# Patient Record
Sex: Female | Born: 1975 | Race: Black or African American | Hispanic: No | Marital: Single | Smoking: Never smoker
Health system: Southern US, Community
[De-identification: ages and names within clinical notes are randomized; demographics above are authoritative.]

## PROBLEM LIST (undated history)

## (undated) DIAGNOSIS — Z803 Family history of malignant neoplasm of breast: Secondary | ICD-10-CM

## (undated) DIAGNOSIS — Z800 Family history of malignant neoplasm of digestive organs: Secondary | ICD-10-CM

## (undated) HISTORY — PX: MOUTH SURGERY: SHX715

## (undated) HISTORY — DX: Family history of malignant neoplasm of digestive organs: Z80.0

## (undated) HISTORY — DX: Family history of malignant neoplasm of breast: Z80.3

---

## 2016-07-10 ENCOUNTER — Ambulatory Visit: Payer: Self-pay | Admitting: Physician Assistant

## 2016-07-10 VITALS — BP 110/70 | HR 85 | Temp 98.5°F

## 2016-07-10 DIAGNOSIS — H6121 Impacted cerumen, right ear: Secondary | ICD-10-CM

## 2016-07-10 NOTE — Progress Notes (Signed)
S: c/o r ear feeling full, used otc ear drops and sweet oil without relief, no fever/chills or head congestion  O: vitals wnl, nad, r ear with cerumen impaction, left ear wnl, neck supple no lymph, lungs c t a, cv rrr, irrigated r ear with warm water, wax removed  A: cerumen impaction  P: f/u prn

## 2016-12-07 ENCOUNTER — Ambulatory Visit: Payer: Self-pay | Admitting: Family

## 2016-12-07 VITALS — BP 119/70 | HR 79 | Temp 98.4°F

## 2016-12-07 DIAGNOSIS — H6123 Impacted cerumen, bilateral: Secondary | ICD-10-CM

## 2016-12-07 NOTE — Progress Notes (Signed)
S / can not hear on R , no ENT , resp acute sxs O VSS R EAC occluded with soft cerumen , L partially occluded with cerumen.  A/ Impacted cerumen bilat P Irrigation successful ,tolerated well. Preventive measures discussed .

## 2017-03-07 ENCOUNTER — Ambulatory Visit: Payer: Self-pay | Admitting: Physician Assistant

## 2017-03-07 ENCOUNTER — Encounter: Payer: Self-pay | Admitting: Physician Assistant

## 2017-03-07 VITALS — BP 110/70 | HR 97 | Temp 98.3°F | Ht 65.0 in | Wt 193.0 lb

## 2017-03-07 DIAGNOSIS — Z0000 Encounter for general adult medical examination without abnormal findings: Secondary | ICD-10-CM

## 2017-03-07 NOTE — Progress Notes (Signed)
S: pt here for wellness physical had biometrics for insurance purposes done at work, hasn't seen her gyn in about 2 years, no complaints ros neg. PMH:  neg  Social: nonsmoker, social etoh, no drugs  Fam: mother died at 5764 of colon CA, father +htn, 1/2 sister with breast ca, dm, thyroid problems, and ibs, 1/2 sister#2 with high cholesterol, 2 brothers are healthy, 1 sister is healthy  O: vitals wnl, nad, ENT wnl, neck supple no lymph, lungs c t a, cv rrr, abd soft nontender bs normal all 4 quads  A: wellness physical  P: f/u with gyn for pelvic/pap, return if any problems

## 2017-06-05 ENCOUNTER — Ambulatory Visit: Payer: Self-pay | Admitting: Physician Assistant

## 2017-06-05 VITALS — BP 120/70 | HR 75 | Temp 98.5°F | Resp 16

## 2017-06-05 DIAGNOSIS — S46912A Strain of unspecified muscle, fascia and tendon at shoulder and upper arm level, left arm, initial encounter: Secondary | ICD-10-CM

## 2017-06-05 MED ORDER — NAPROXEN 500 MG PO TABS: 500.0000 mg | ORAL_TABLET | Freq: Two times a day (BID) | ORAL | 0 refills | Status: DC

## 2017-06-05 NOTE — Progress Notes (Signed)
S:  Here with left shoulder pain.  Lifting a lot and has been hurting about 1 week.  Took low dose and infrequent IBU without improvement.   O:  Tender trap muscle to palpation.  No limitation to ROM, no crepitus.  Good muscle strength. A:  Left shoulder strain P: Naprosyn 500mg  bid and moist heat/ ice to area

## 2018-04-11 ENCOUNTER — Ambulatory Visit: Payer: Self-pay | Admitting: Family Medicine

## 2018-04-11 VITALS — BP 134/71 | HR 76 | Resp 16 | Ht 64.0 in | Wt 188.0 lb

## 2018-04-11 DIAGNOSIS — Z0189 Encounter for other specified special examinations: Principal | ICD-10-CM

## 2018-04-11 DIAGNOSIS — Z008 Encounter for other general examination: Secondary | ICD-10-CM

## 2018-04-11 NOTE — Progress Notes (Signed)
Subjective: Annual biometrics screening  Patient presents for her annual biometric screening. Patient reports eating a well-rounded diet but that there is room for improvement in the amount of healthy food she eats.  Patient reports getting physical activity once a week but plans to increase this in the near future.  Patient does not regularly see a primary care provider. PCP: None currently. Patient works for [employer]. Patient denies any other issues or concerns.   Review of Systems Unremarkable  Objective  Physical Exam General: Awake, alert and oriented. No acute distress. Well developed, hydrated and nourished. Appears stated age.  HEENT: Supple neck without adenopathy. Sclera is non-icteric. The ear canal is clear without discharge. The tympanic membrane is normal in appearance with normal landmarks and cone of light. Nasal mucosa is pink and moist. Oral mucosa is pink and moist. The pharynx is normal in appearance without tonsillar swelling or exudates.  Skin: Skin in warm, dry and intact without rashes or lesions. Appropriate color for ethnicity. Cardiac: Heart rate and rhythm are normal. No murmurs, gallops, or rubs are auscultated.  Respiratory: The chest wall is symmetric and without deformity. No signs of respiratory distress. Lung sounds are clear in all lobes bilaterally without rales, ronchi, or wheezes.  Neurological: The patient is awake, alert and oriented to person, place, and time with normal speech.  Memory is normal and thought processes intact. No gait abnormalities are appreciated.  Psychiatric: Appropriate mood and affect.   Assessment Annual biometrics screening  Plan  Lipid panel and fasting blood sugar pending. Encouraged routine visits with primary care provider.  Provided patient with a list of local resources and encouraged her to establish care with a primary care provider within the next month. Encouraged patient to get regular exercise and eat a healthy,  well-rounded diet.

## 2018-04-12 LAB — LIPID PANEL
CHOL/HDL RATIO: 3 ratio (ref 0.0–4.4)
Cholesterol, Total: 184 mg/dL (ref 100–199)
HDL: 62 mg/dL (ref >39)
LDL Calculated: 103 mg/dL — ABNORMAL HIGH (ref 0–99)
Triglycerides: 93 mg/dL (ref 0–149)
VLDL Cholesterol Cal: 19 mg/dL (ref 5–40)

## 2018-04-12 LAB — GLUCOSE, RANDOM: GLUCOSE: 79 mg/dL (ref 65–99)

## 2018-04-14 NOTE — Progress Notes (Signed)
[name], Will you call the patient and inform them that their lipid panel and fasting blood sugar came back?  Everything is normal, with the exception of her LDL cholesterol. The LDL cholesterol ("bad cholesterol") is elevated at 103, normal values are below 99. Please advise the patient to follow-up with their primary care provider regarding these results.

## 2019-04-01 ENCOUNTER — Other Ambulatory Visit: Payer: Self-pay | Admitting: Obstetrics and Gynecology

## 2019-04-01 DIAGNOSIS — R928 Other abnormal and inconclusive findings on diagnostic imaging of breast: Secondary | ICD-10-CM

## 2019-04-17 ENCOUNTER — Ambulatory Visit
Admission: RE | Admit: 2019-04-17 | Discharge: 2019-04-17 | Disposition: A | Discharge status: Home / Self Care | Payer: Managed Care, Other (non HMO) | Source: Ambulatory Visit | Attending: Obstetrics and Gynecology | Admitting: Obstetrics and Gynecology

## 2019-04-17 ENCOUNTER — Other Ambulatory Visit: Payer: Self-pay | Admitting: Obstetrics and Gynecology

## 2019-04-17 ENCOUNTER — Other Ambulatory Visit: Payer: Self-pay

## 2019-04-17 DIAGNOSIS — R928 Other abnormal and inconclusive findings on diagnostic imaging of breast: Secondary | ICD-10-CM

## 2019-04-20 ENCOUNTER — Other Ambulatory Visit: Payer: Managed Care, Other (non HMO)

## 2019-04-27 ENCOUNTER — Ambulatory Visit
Admission: RE | Admit: 2019-04-27 | Discharge: 2019-04-27 | Disposition: A | Discharge status: Home / Self Care | Payer: Managed Care, Other (non HMO) | Source: Ambulatory Visit | Attending: Obstetrics and Gynecology | Admitting: Obstetrics and Gynecology

## 2019-04-27 ENCOUNTER — Other Ambulatory Visit: Payer: Self-pay | Admitting: Obstetrics and Gynecology

## 2019-04-27 DIAGNOSIS — N6489 Other specified disorders of breast: Secondary | ICD-10-CM

## 2019-04-27 DIAGNOSIS — R928 Other abnormal and inconclusive findings on diagnostic imaging of breast: Secondary | ICD-10-CM

## 2019-07-29 ENCOUNTER — Ambulatory Visit (INDEPENDENT_AMBULATORY_CARE_PROVIDER_SITE_OTHER): Payer: Managed Care, Other (non HMO) | Admitting: Internal Medicine

## 2019-07-29 ENCOUNTER — Encounter: Payer: Self-pay | Admitting: Internal Medicine

## 2019-07-29 VITALS — BP 124/72 | HR 72 | Temp 98.1°F | Ht 64.0 in | Wt 187.6 lb

## 2019-07-29 DIAGNOSIS — Z1211 Encounter for screening for malignant neoplasm of colon: Secondary | ICD-10-CM

## 2019-07-29 DIAGNOSIS — Z800 Family history of malignant neoplasm of digestive organs: Secondary | ICD-10-CM

## 2019-07-29 NOTE — Progress Notes (Signed)
   Denise Jones 43 y.o. February 24, 1976 [id number] Referred by: Brien Few, MD  Assessment & Plan:   Encounter Diagnoses  Name Primary?  . Colon cancer screening Yes  . Family history of colon cancer in mother - 39     I believe it is appropriate for her to have a colonoscopy.  Further plans pending that result.   The risks and benefits as well as alternatives of endoscopic procedure(s) have been discussed and reviewed. All questions answered. The patient agrees to proceed.   Patient was not charged for the visit, we only needed to schedule the colonoscopy and we often do that with a nurse visit alone.  I was happy to see her nevertheless.   I appreciate the opportunity to care for this patient. Copy to Dr. Brien Few   Subjective:   Chief Complaint: Family history of colon cancer in mother schedule colonoscopy  HPI The patient has no active GI symptoms.  She is here to discuss scheduling a screening colonoscopy because her mother was diagnosed and died from colon cancer at ages 18 and 63.  No other family members with colon cancer. No Known Allergies No outpatient medications have been marked as taking for the 07/29/19 encounter (Office Visit) with Gatha Mayer, MD.   History reviewed. No pertinent past medical history. Past Surgical History:  Procedure Laterality Date  . MOUTH SURGERY     Social History   Social History Narrative   Single, lives alone no children.  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   Prior work in Therapist, art.   Graduate of [employer] originally from [place]   No alcohol tobacco or drug use and 1 caffeinated beverage daily   family history includes Breast cancer in her cousin and maternal aunt; Breast cancer (age of onset: 49) in her sister; Colon cancer (age of onset: 68) in her mother; Diabetes in her mother and sister; Hyperlipidemia in her sister; Hypertension in her father; Irritable  bowel syndrome in her sister; Thyroid disease in her sister.   Review of Systems Negative  Objective:   Physical Exam BP 124/72   Pulse 72   Temp 98.1 F (36.7 C) (Oral)   Ht 5\' 4"  (1.626 m)   Wt 187 lb 9.6 oz (85.1 kg)   LMP 07/29/2019 (Exact Date)   BMI 32.20 kg/m  No acute distress Lungs clear Normal heart sounds

## 2019-07-29 NOTE — Patient Instructions (Signed)
You have been scheduled for a colonoscopy. Please follow written instructions given to you at your visit today.  Please pick up your prep supplies at the pharmacy within the next 1-3 days. If you use inhalers (even only as needed), please bring them with you on the day of your procedure. Your physician has requested that you go to www.startemmi.com and enter the access code given to you at your visit today. This web site gives a general overview about your procedure. However, you should still follow specific instructions given to you by our office regarding your preparation for the procedure.  If you are age 95 or older, your body mass index should be between 23-30. Your Body mass index is 32.2 kg/m. If this is out of the aforementioned range listed, please consider follow up with your Primary Care Provider.  If you are age 74 or younger, your body mass index should be between 19-25. Your Body mass index is 32.2 kg/m. If this is out of the aformentioned range listed, please consider follow up with your Primary Care Provider.

## 2019-08-20 ENCOUNTER — Encounter: Payer: Self-pay | Admitting: Internal Medicine

## 2019-08-21 ENCOUNTER — Telehealth: Payer: Self-pay | Admitting: Internal Medicine

## 2019-08-21 NOTE — Telephone Encounter (Signed)

## 2019-08-24 ENCOUNTER — Encounter: Payer: Self-pay | Admitting: Internal Medicine

## 2019-08-24 ENCOUNTER — Ambulatory Visit (AMBULATORY_SURGERY_CENTER): Payer: Managed Care, Other (non HMO) | Admitting: Internal Medicine

## 2019-08-24 ENCOUNTER — Other Ambulatory Visit: Payer: Self-pay

## 2019-08-24 VITALS — BP 101/78 | HR 75 | Temp 98.3°F | Resp 13 | Ht 64.0 in | Wt 187.0 lb

## 2019-08-24 DIAGNOSIS — Z800 Family history of malignant neoplasm of digestive organs: Secondary | ICD-10-CM | POA: Diagnosis not present

## 2019-08-24 DIAGNOSIS — Z1211 Encounter for screening for malignant neoplasm of colon: Secondary | ICD-10-CM

## 2019-08-24 MED ORDER — SODIUM CHLORIDE 0.9 % IV SOLN: 500.0000 mL | Freq: Once | INTRAVENOUS | Status: DC

## 2019-08-24 NOTE — Progress Notes (Signed)
Temp taken by LS VS taken by CW 

## 2019-08-24 NOTE — Patient Instructions (Addendum)
The colonoscopy was normal.  Your next routine colonoscopy should be in 5 years - 2025.  I appreciate the opportunity to care for you. Gatha Mayer, MD, FACG   YOU HAD AN ENDOSCOPIC PROCEDURE TODAY AT [facility]:   Refer to the procedure report that was given to you for any specific questions about what was found during the examination.  If the procedure report does not answer your questions, please call your gastroenterologist to clarify.  If you requested that your care partner not be given the details of your procedure findings, then the procedure report has been included in a sealed envelope for you to review at your convenience later.  YOU SHOULD EXPECT: Some feelings of bloating in the abdomen. Passage of more gas than usual.  Walking can help get rid of the air that was put into your GI tract during the procedure and reduce the bloating. If you had a lower endoscopy (such as a colonoscopy or flexible sigmoidoscopy) you may notice spotting of blood in your stool or on the toilet paper. If you underwent a bowel prep for your procedure, you may not have a normal bowel movement for a few days.  Please Note:  You might notice some irritation and congestion in your nose or some drainage.  This is from the oxygen used during your procedure.  There is no need for concern and it should clear up in a day or so.  SYMPTOMS TO REPORT IMMEDIATELY:   Following lower endoscopy (colonoscopy or flexible sigmoidoscopy):  Excessive amounts of blood in the stool  Significant tenderness or worsening of abdominal pains  Swelling of the abdomen that is new, acute  Fever of 100F or higher   For urgent or emergent issues, a gastroenterologist can be reached at any hour by calling [phone].   DIET:  We do recommend a small meal at first, but then you may proceed to your regular diet.  Drink plenty of fluids but you should avoid alcoholic beverages for 24 hours.  ACTIVITY:  You  should plan to take it easy for the rest of today and you should NOT DRIVE or use heavy machinery until tomorrow (because of the sedation medicines used during the test).    FOLLOW UP: Our staff will call the number listed on your records 48-72 hours following your procedure to check on you and address any questions or concerns that you may have regarding the information given to you following your procedure. If we do not reach you, we will leave a message.  We will attempt to reach you two times.  During this call, we will ask if you have developed any symptoms of COVID 19. If you develop any symptoms (ie: fever, flu-like symptoms, shortness of breath, cough etc.) before then, please call [phone].  If you test positive for Covid 19 in the 2 weeks post procedure, please call and report this information to [place].    If any biopsies were taken you will be contacted by phone or by letter within the next 1-3 weeks.  Please call us at [phone] if you have not heard about the biopsies in 3 weeks.    SIGNATURES/CONFIDENTIALITY: You and/or your care partner have signed paperwork which will be entered into your electronic medical record.  These signatures attest to the fact that that the information above on your After Visit Summary has been reviewed and is understood.  Full responsibility of the confidentiality of this discharge information lies  with you and/or your care-partner.

## 2019-08-24 NOTE — Op Note (Signed)
[facility] Patient Name: Denise Jones Procedure Date: 08/24/2019 10:15 AM MRN: [id number] Endoscopist: Gatha Mayer , MD Age: 43 Referring MD:  Date of Birth: [birth year] Gender: Female Account #: [id number] Procedure:                Colonoscopy Indications:              Screening in patient at increased risk: Colorectal                            cancer in mother before age 55 Medicines:                Propofol per Anesthesia, Monitored Anesthesia Care Procedure:                Pre-Anesthesia Assessment:                           - Prior to the procedure, a History and Physical                            was performed, and patient medications and                            allergies were reviewed. The patient's tolerance of                            previous anesthesia was also reviewed. The risks                            and benefits of the procedure and the sedation                            options and risks were discussed with the patient.                            All questions were answered, and informed consent                            was obtained. Prior Anticoagulants: The patient has                            taken no previous anticoagulant or antiplatelet                            agents. ASA Grade Assessment: II - A patient with                            mild systemic disease. After reviewing the risks                            and benefits, the patient was deemed in                            satisfactory condition to undergo the procedure.  After obtaining informed consent, the colonoscope                            was passed under direct vision. Throughout the                            procedure, the patient's blood pressure, pulse, and                            oxygen saturations were monitored continuously. The                            Colonoscope was introduced through the anus and   advanced to the the cecum, identified by                            appendiceal orifice and ileocecal valve. The                            quality of the bowel preparation was excellent. The                            colonoscopy was performed without difficulty. The                            patient tolerated the procedure well. The bowel                            preparation used was Miralax via split dose                            instruction. Scope In: 10:24:29 AM Scope Out: 10:36:15 AM Scope Withdrawal Time: 0 hours 8 minutes 58 seconds  Total Procedure Duration: 0 hours 11 minutes 46 seconds  Findings:                 The perianal and digital rectal examinations were                            normal.                           The colon (entire examined portion) appeared normal.                           No additional abnormalities were found on                            retroflexion. Complications:            No immediate complications. Estimated blood loss:                            None. Estimated Blood Loss:     Estimated blood loss: none. Impression:               - The entire examined colon is normal.                           -  No specimens collected.                           Family hx colon cancer - mom dxed pre 3960 and died                            at 3362 Recommendation:           - Repeat colonoscopy in 5 years for screening                            purposes.                           - Patient has a contact number available for                            emergencies. The signs and symptoms of potential                            delayed complications were discussed with the                            patient. Return to normal activities tomorrow.                            Written discharge instructions were provided to the                            patient.                           - Resume previous diet.                           - Continue present  medications. Iva Booparl E Gessner, MD 08/24/2019 10:41:06 AM This report has been signed electronically.

## 2019-08-24 NOTE — Progress Notes (Signed)
To PACU, VSS. Report to [email or web address] 

## 2019-08-26 ENCOUNTER — Telehealth: Payer: Self-pay

## 2019-08-26 NOTE — Telephone Encounter (Signed)
  Follow up Call-  Call back number 08/24/2019  Post procedure Call Back phone  # [phone]  Permission to leave phone message Yes  Some recent data might be hidden     Patient questions:  Do you have a fever, pain , or abdominal swelling? No. Pain Score  0 *  Have you tolerated food without any problems? Yes.    Have you been able to return to your normal activities? Yes.    Do you have any questions about your discharge instructions: Diet   No. Medications  No. Follow up visit  No.  Do you have questions or concerns about your Care? No.  Actions: * If pain score is 4 or above: No action needed, pain <4.  1. Have you developed a fever since your procedure? no  2.   Have you had an respiratory symptoms (SOB or cough) since your procedure? no  3.   Have you tested positive for COVID 19 since your procedure no  4.   Have you had any family members/close contacts diagnosed with the COVID 19 since your procedure?  no   If yes to any of these questions please route to Joylene John, RN and Alphonsa Gin, Therapist, sports.

## 2019-11-03 ENCOUNTER — Other Ambulatory Visit: Payer: Managed Care, Other (non HMO)

## 2020-03-17 ENCOUNTER — Ambulatory Visit: Payer: Managed Care, Other (non HMO) | Admitting: Nurse Practitioner

## 2020-03-17 ENCOUNTER — Other Ambulatory Visit: Payer: Self-pay

## 2020-03-17 ENCOUNTER — Encounter: Payer: Self-pay | Admitting: Nurse Practitioner

## 2020-03-17 VITALS — BP 124/80 | HR 72 | Temp 98.0°F | Resp 18 | Ht 63.0 in | Wt 192.0 lb

## 2020-03-17 DIAGNOSIS — Z008 Encounter for other general examination: Secondary | ICD-10-CM

## 2020-03-17 NOTE — Progress Notes (Signed)
Subjective:     Patient ID: Denise Jones, female   DOB: Aug 25, 1976, 44 y.o.   MRN: [id number]  HPI Denise Jones is a 44 y.o. female who presents to the [facility] for her biomedical screening. 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 She reports that she enjoys her job but it can be stressful. She denies any health problems. Employee reports that due to Covid she has not had a regular exercise routine the past year but does try and eat a healthy diet.   Review of Systems  Psychiatric/Behavioral:       Occasional stress with the job.   All other systems reviewed and are negative.     PMH: No health problems Meds: None Allergies: NKDA Immunizations: Has had both does of Covid Vaccine  Objective: BP 124/80 (BP Location: Right Arm, Patient Position: Sitting, Cuff Size: Normal)   Pulse 72   Temp 98 F (36.7 C) (Temporal)   Resp 18   Ht 5\' 3"  (1.6 m)   Wt 192 lb (87.1 kg)   SpO2 98%   BMI 34.01 kg/m     Physical Exam Vitals and nursing note reviewed.  Constitutional:      General: She is not in acute distress.    Appearance: Normal appearance.  HENT:     Head: Normocephalic and atraumatic.     Right Ear: Tympanic membrane, ear canal and external ear normal.     Left Ear: Tympanic membrane, ear canal and external ear normal.     Nose: Nose normal.     Mouth/Throat:     Mouth: Mucous membranes are moist.     Pharynx: Oropharynx is clear.  Eyes:     Conjunctiva/sclera: Conjunctivae normal.  Neck:     Vascular: No carotid bruit.     Trachea: Trachea normal.  Cardiovascular:     Rate and Rhythm: Normal rate and regular rhythm.  Pulmonary:     Effort: Pulmonary effort is normal.     Breath sounds: Normal breath sounds.  Abdominal:     Tenderness: There is no abdominal tenderness. There is no right CVA tenderness or left CVA tenderness.  Musculoskeletal:        General: Normal range of motion.     Cervical back: Normal range of  motion.  Lymphadenopathy:     Cervical: No cervical adenopathy.  Skin:    General: Skin is warm and dry.  Neurological:     General: No focal deficit present.     Mental Status: She is alert.     Motor: No weakness or pronator drift.     Coordination: Romberg sign negative.     Gait: Gait normal.     Deep Tendon Reflexes:     Reflex Scores:      Bicep reflexes are 2+ on the right side and 2+ on the left side.      Brachioradialis reflexes are 2+ on the right side and 2+ on the left side.      Patellar reflexes are 2+ on the right side and 2+ on the left side.    Comments: Ambulatory with steady gait, stands on one foot without difficulty, grips are equal.   Psychiatric:        Mood and Affect: Mood normal.        Behavior: Behavior normal.        Assessment:    1. Encounter for other general examination  2. Encounter for biometric screening - Lipid panel pending - Glucose, random pending Limited physical exam normal    Plan:    Discussed with the employee importance of healthy diet and exercise. Encouraged employee to establish care with a PCP. Instructions given verbally and in writing. Employee given opportunity to ask questions. All questions answered and employee voices understanding. She will RTC as needed.

## 2020-03-17 NOTE — Patient Instructions (Addendum)
Health Maintenance, Female Adopting a healthy lifestyle and getting preventive care are important in promoting health and wellness. Ask your health care provider about:  The right schedule for you to have regular tests and exams.  Things you can do on your own to prevent diseases and keep yourself healthy. What should I know about diet, weight, and exercise? Eat a healthy diet   Eat a diet that includes plenty of vegetables, fruits, low-fat dairy products, and lean protein.  Do not eat a lot of foods that are high in solid fats, added sugars, or sodium. Maintain a healthy weight Body mass index (BMI) is used to identify weight problems. It estimates body fat based on height and weight. Your health care provider can help determine your BMI and help you achieve or maintain a healthy weight. Get regular exercise Get regular exercise. This is one of the most important things you can do for your health. Most adults should:  Exercise for at least 150 minutes each week. The exercise should increase your heart rate and make you sweat (moderate-intensity exercise).  Do strengthening exercises at least twice a week. This is in addition to the moderate-intensity exercise.  Spend less time sitting. Even light physical activity can be beneficial. Watch cholesterol and blood lipids Have your blood tested for lipids and cholesterol at 42 years of age, then have this test every 5 years. Have your cholesterol levels checked more often if:  Your lipid or cholesterol levels are high.  You are older than 42 years of age.  You are at high risk for heart disease. What should I know about cancer screening? Depending on your health history and family history, you may need to have cancer screening at various ages. This may include screening for:  Breast cancer.  Cervical cancer.  Colorectal cancer.  Skin cancer.  Lung cancer. What should I know about heart disease, diabetes, and high blood  pressure? Blood pressure and heart disease  High blood pressure causes heart disease and increases the risk of stroke. This is more likely to develop in people who have high blood pressure readings, are of African descent, or are overweight.  Have your blood pressure checked: ? Every 3-5 years if you are 18-39 years of age. ? Every year if you are 40 years old or older. Diabetes Have regular diabetes screenings. This checks your fasting blood sugar level. Have the screening done:  Once every three years after age 40 if you are at a normal weight and have a low risk for diabetes.  More often and at a younger age if you are overweight or have a high risk for diabetes. What should I know about preventing infection? Hepatitis B If you have a higher risk for hepatitis B, you should be screened for this virus. Talk with your health care provider to find out if you are at risk for hepatitis B infection. Hepatitis C Testing is recommended for:  Everyone born from 1945 through 1965.  Anyone with known risk factors for hepatitis C. Sexually transmitted infections (STIs)  Get screened for STIs, including gonorrhea and chlamydia, if: ? You are sexually active and are younger than 42 years of age. ? You are older than 42 years of age and your health care provider tells you that you are at risk for this type of infection. ? Your sexual activity has changed since you were last screened, and you are at increased risk for chlamydia or gonorrhea. Ask your health care provider if   you are at risk.  Ask your health care provider about whether you are at high risk for HIV. Your health care provider may recommend a prescription medicine to help prevent HIV infection. If you choose to take medicine to prevent HIV, you should first get tested for HIV. You should then be tested every 3 months for as long as you are taking the medicine. Pregnancy  If you are about to stop having your period (premenopausal) and  you may become pregnant, seek counseling before you get pregnant.  Take 400 to 800 micrograms (mcg) of folic acid every day if you become pregnant.  Ask for birth control (contraception) if you want to prevent pregnancy. Osteoporosis and menopause Osteoporosis is a disease in which the bones lose minerals and strength with aging. This can result in bone fractures. If you are 65 years old or older, or if you are at risk for osteoporosis and fractures, ask your health care provider if you should:  Be screened for bone loss.  Take a calcium or vitamin D supplement to lower your risk of fractures.  Be given hormone replacement therapy (HRT) to treat symptoms of menopause. Follow these instructions at home: Lifestyle  Do not use any products that contain nicotine or tobacco, such as cigarettes, e-cigarettes, and chewing tobacco. If you need help quitting, ask your health care provider.  Do not use street drugs.  Do not share needles.  Ask your health care provider for help if you need support or information about quitting drugs. Alcohol use  Do not drink alcohol if: ? Your health care provider tells you not to drink. ? You are pregnant, may be pregnant, or are planning to become pregnant.  If you drink alcohol: ? Limit how much you use to 0-1 drink a day. ? Limit intake if you are breastfeeding.  Be aware of how much alcohol is in your drink. In the [place] one drink equals one 12 oz bottle of beer (355 mL), one 5 oz glass of wine (148 mL), or one 1 oz glass of hard liquor (44 mL). General instructions  Schedule regular health, dental, and eye exams.  Stay current with your vaccines.  Tell your health care provider if: ? You often feel depressed. ? You have ever been abused or do not feel safe at home. Summary  Adopting a healthy lifestyle and getting preventive care are important in promoting health and wellness.  Follow your health care provider's instructions about healthy  diet, exercising, and getting tested or screened for diseases.  Follow your health care provider's instructions on monitoring your cholesterol and blood pressure. This information is not intended to replace advice given to you by your health care provider. Make sure you discuss any questions you have with your health care provider. Document Revised: 10/15/2018 Document Reviewed: 10/15/2018 Elsevier Patient Education  2020 Elsevier Inc.  

## 2020-03-18 LAB — GLUCOSE, RANDOM: Glucose: 87 mg/dL (ref 65–99)

## 2020-03-18 LAB — LIPID PANEL
Chol/HDL Ratio: 2.9 ratio (ref 0.0–4.4)
Cholesterol, Total: 183 mg/dL (ref 100–199)
HDL: 63 mg/dL (ref >39)
LDL Chol Calc (NIH): 110 mg/dL — ABNORMAL HIGH (ref 0–99)
Triglycerides: 53 mg/dL (ref 0–149)
VLDL Cholesterol Cal: 10 mg/dL (ref 5–40)

## 2020-05-06 ENCOUNTER — Ambulatory Visit
Admission: RE | Admit: 2020-05-06 | Discharge: 2020-05-06 | Disposition: A | Discharge status: Home / Self Care | Payer: Managed Care, Other (non HMO) | Source: Ambulatory Visit | Attending: Obstetrics and Gynecology | Admitting: Obstetrics and Gynecology

## 2020-05-06 ENCOUNTER — Other Ambulatory Visit: Payer: Self-pay

## 2020-05-06 ENCOUNTER — Other Ambulatory Visit: Payer: Self-pay | Admitting: Obstetrics and Gynecology

## 2020-05-06 DIAGNOSIS — N6489 Other specified disorders of breast: Secondary | ICD-10-CM

## 2020-05-06 DIAGNOSIS — N6320 Unspecified lump in the left breast, unspecified quadrant: Secondary | ICD-10-CM

## 2020-11-09 ENCOUNTER — Ambulatory Visit
Admission: RE | Admit: 2020-11-09 | Discharge: 2020-11-09 | Disposition: A | Discharge status: Home / Self Care | Payer: Managed Care, Other (non HMO) | Source: Ambulatory Visit | Attending: Obstetrics and Gynecology | Admitting: Obstetrics and Gynecology

## 2020-11-09 ENCOUNTER — Other Ambulatory Visit: Payer: Self-pay

## 2020-11-09 ENCOUNTER — Other Ambulatory Visit: Payer: Self-pay | Admitting: Obstetrics and Gynecology

## 2020-11-09 DIAGNOSIS — N6320 Unspecified lump in the left breast, unspecified quadrant: Secondary | ICD-10-CM

## 2021-05-16 ENCOUNTER — Other Ambulatory Visit: Payer: Managed Care, Other (non HMO)

## 2021-05-18 ENCOUNTER — Ambulatory Visit
Admission: RE | Admit: 2021-05-18 | Discharge: 2021-05-18 | Disposition: A | Discharge status: Home / Self Care | Payer: Managed Care, Other (non HMO) | Source: Ambulatory Visit | Attending: Obstetrics and Gynecology | Admitting: Obstetrics and Gynecology

## 2021-05-18 ENCOUNTER — Other Ambulatory Visit: Payer: Self-pay

## 2021-05-18 DIAGNOSIS — N6320 Unspecified lump in the left breast, unspecified quadrant: Secondary | ICD-10-CM

## 2021-06-26 ENCOUNTER — Other Ambulatory Visit: Payer: Managed Care, Other (non HMO)

## 2021-06-26 ENCOUNTER — Other Ambulatory Visit: Payer: Self-pay

## 2021-06-26 VITALS — BP 128/78 | HR 72 | Temp 97.9°F | Resp 15 | Ht 63.0 in | Wt 174.0 lb

## 2021-06-26 DIAGNOSIS — Z008 Encounter for other general examination: Secondary | ICD-10-CM

## 2021-06-27 LAB — LIPID PANEL
Chol/HDL Ratio: 3.3 ratio (ref 0.0–4.4)
Cholesterol, Total: 179 mg/dL (ref 100–199)
HDL: 54 mg/dL (ref >39)
LDL Chol Calc (NIH): 110 mg/dL — ABNORMAL HIGH (ref 0–99)
Triglycerides: 79 mg/dL (ref 0–149)
VLDL Cholesterol Cal: 15 mg/dL (ref 5–40)

## 2021-06-27 LAB — GLUCOSE, RANDOM: Glucose: 87 mg/dL (ref 65–99)

## 2022-05-17 ENCOUNTER — Other Ambulatory Visit: Payer: Self-pay | Admitting: Obstetrics and Gynecology

## 2022-05-17 DIAGNOSIS — Z1231 Encounter for screening mammogram for malignant neoplasm of breast: Secondary | ICD-10-CM

## 2022-05-28 ENCOUNTER — Ambulatory Visit
Admission: RE | Admit: 2022-05-28 | Discharge: 2022-05-28 | Disposition: A | Discharge status: Home / Self Care | Payer: Managed Care, Other (non HMO) | Source: Ambulatory Visit | Attending: Obstetrics and Gynecology | Admitting: Obstetrics and Gynecology

## 2022-05-28 DIAGNOSIS — Z1231 Encounter for screening mammogram for malignant neoplasm of breast: Secondary | ICD-10-CM

## 2022-06-28 IMAGING — US US BREAST*L* LIMITED INC AXILLA
1 series · 10 of 10 positions shown · non-contrast
Comparison: Previous exam(s).

CLINICAL DATA: 44-year-old female presenting for annual exam as
well as follow-up of probably benign left breast masses.

EXAM:
DIGITAL DIAGNOSTIC BILATERAL MAMMOGRAM WITH TOMOSYNTHESIS AND CAD;
ULTRASOUND LEFT BREAST LIMITED
TECHNIQUE: Bilateral digital diagnostic mammography and breast tomosynthesis
was performed. The images were evaluated with computer-aided
detection.; Targeted ultrasound examination of the left breast was
performed

[Series 1: us breast*left* limited inc axilla · 0.05mm/px · 10 of 10 slices shown]
[im 1/10]
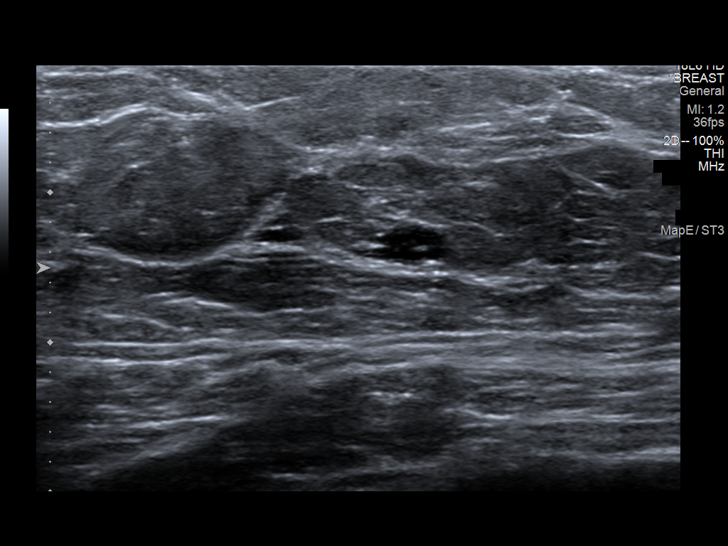
[im 2/10]
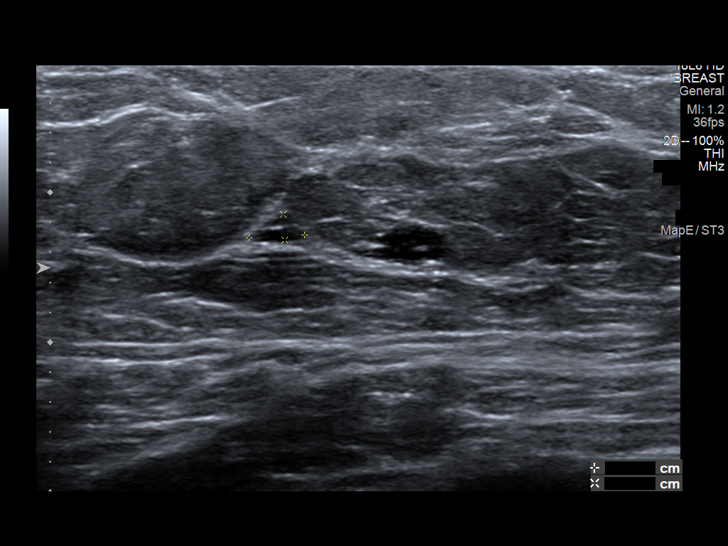
[im 3/10]
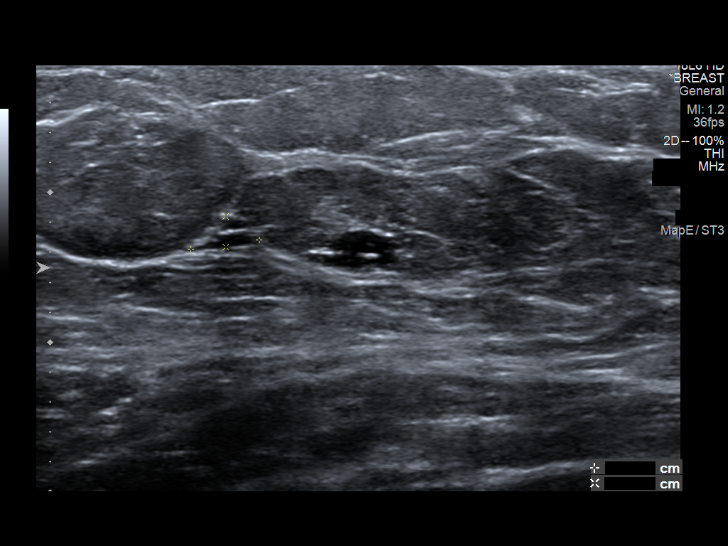
[im 4/10]
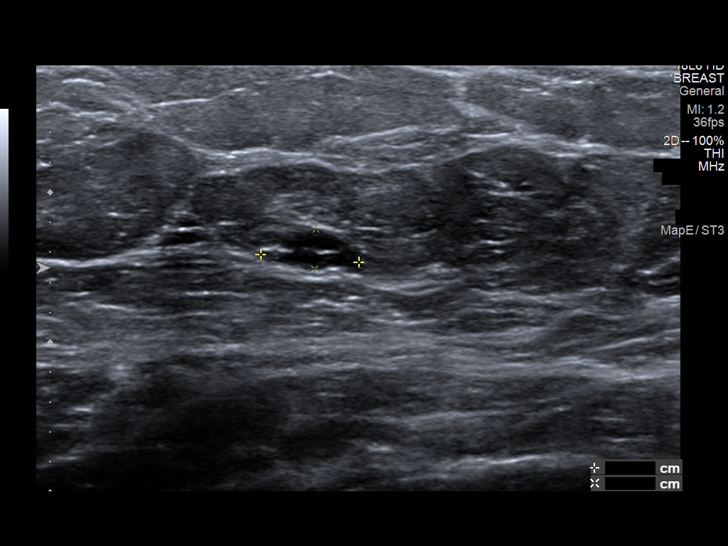
[im 5/10]
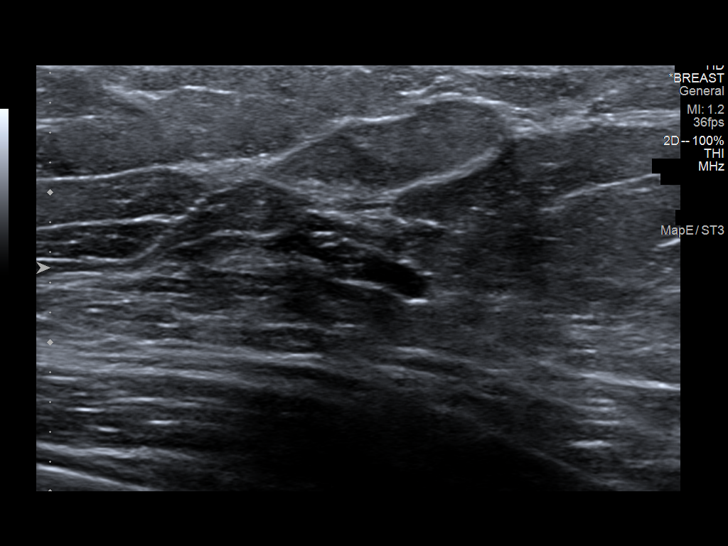
[im 6/10]
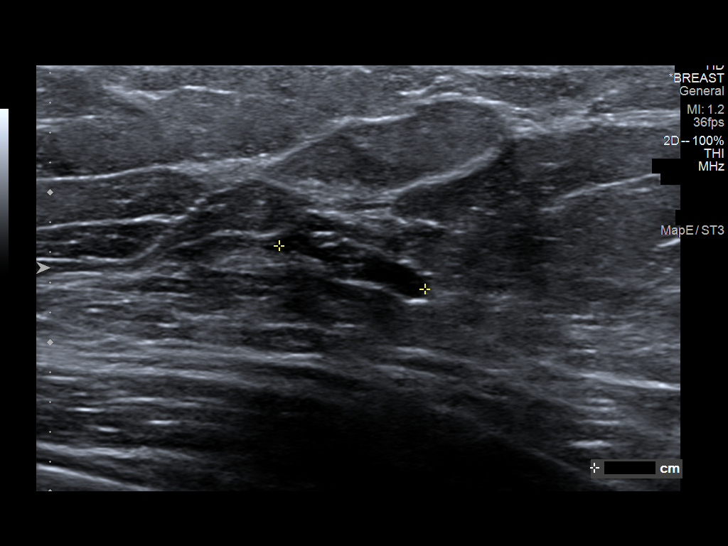
[im 7/10]
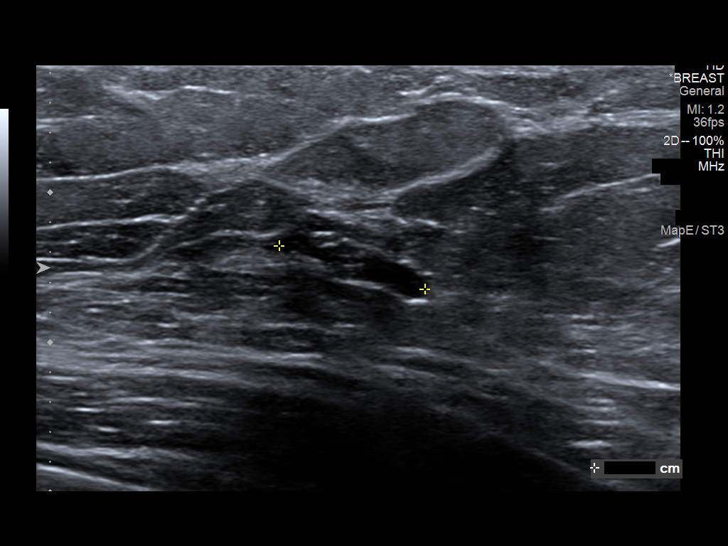
[im 8/10]
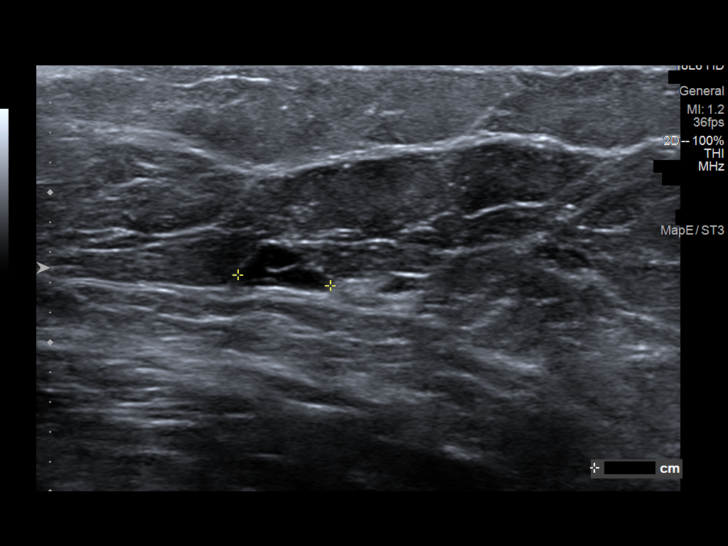
[im 9/10]
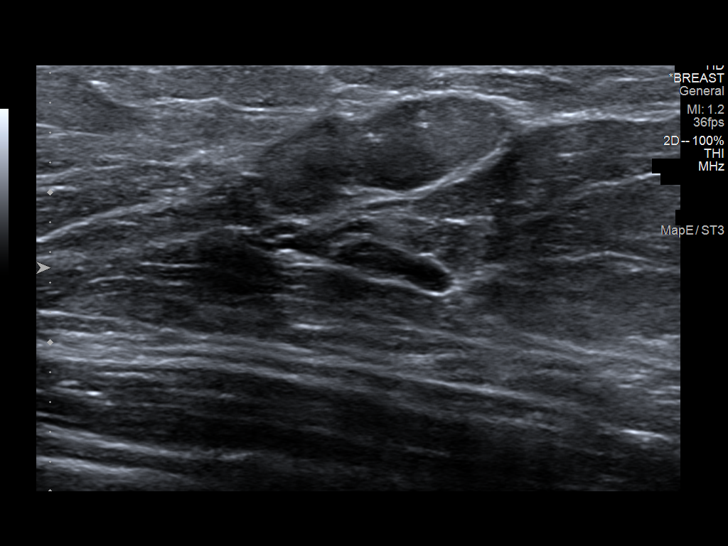
[im 10/10]
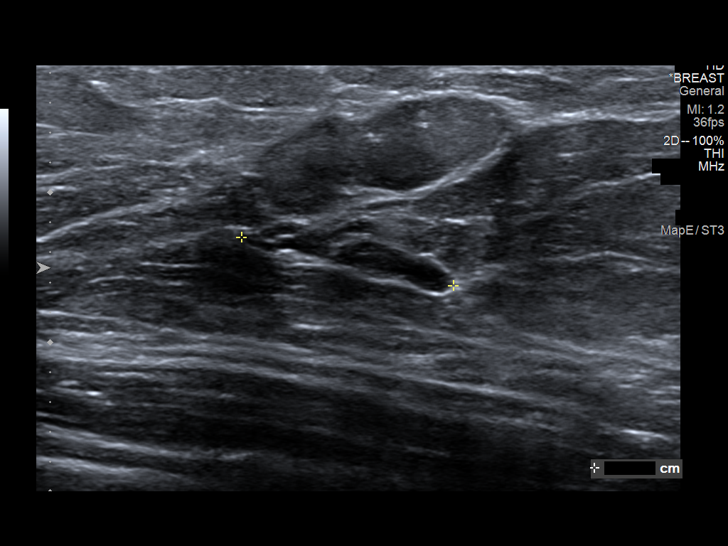

[10 of 10 positions shown; findings below may reference images not displayed]

ACR Breast Density Category b: There are scattered areas of
fibroglandular density.
FINDINGS: Mammogram:

Right breast: No suspicious mass, distortion, or microcalcifications
are identified to suggest presence of malignancy.

Left breast: There are stable masses in the upper outer left breast
posterior depth. There are no new suspicious findings elsewhere in
the left breast.

Ultrasound:

Targeted ultrasound performed in the left breast at 1:30 o'clock 10
cm from the nipple demonstrating 2 clusters of cysts, which actually
[DATE] larger conglomerate cluster of cysts. The first
cluster measures 0.5 x 0.2 cm, previously measuring 0.7 x 0.4 cm and
the second cluster measures 0.7 x 0.3 cm, previously measuring 0.6 x
0.3 cm. In the oblique view overall the cluster of cysts measure
cm, previously 1.9 cm.
IMPRESSION: 1. Stable to slight decrease in size of clusters of cysts in the
left breast at 1:30 o'clock. Given no significant growth/change in
appearance over a 2 year period, these are considered benign.

2.  No mammographic evidence of malignancy bilaterally.

RECOMMENDATION:
Screening mammogram in one year.(Code:[id number])

I have discussed the findings and recommendations with the patient.
If applicable, a reminder letter will be sent to the patient
regarding the next appointment.

BI-RADS CATEGORY  2: Benign.

## 2022-06-28 IMAGING — MG DIGITAL DIAGNOSTIC BILAT W/ TOMO W/ CAD
6 of 10 series · 6 of 30 positions shown · non-contrast
Comparison: Previous exam(s).

CLINICAL DATA: 44-year-old female presenting for annual exam as
well as follow-up of probably benign left breast masses.

EXAM:
DIGITAL DIAGNOSTIC BILATERAL MAMMOGRAM WITH TOMOSYNTHESIS AND CAD;
ULTRASOUND LEFT BREAST LIMITED
TECHNIQUE: Bilateral digital diagnostic mammography and breast tomosynthesis
was performed. The images were evaluated with computer-aided
detection.; Targeted ultrasound examination of the left breast was
performed

[L MLO synth-2D]
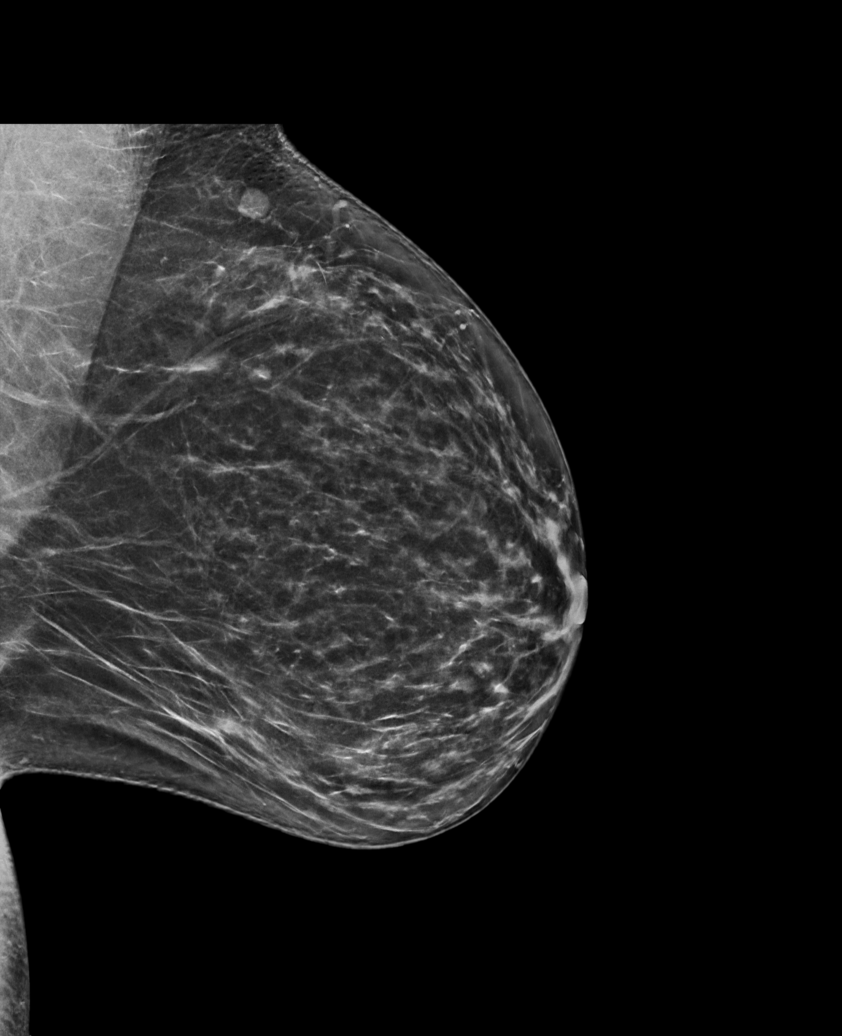

[R MLO synth-2D (1 of 2)]
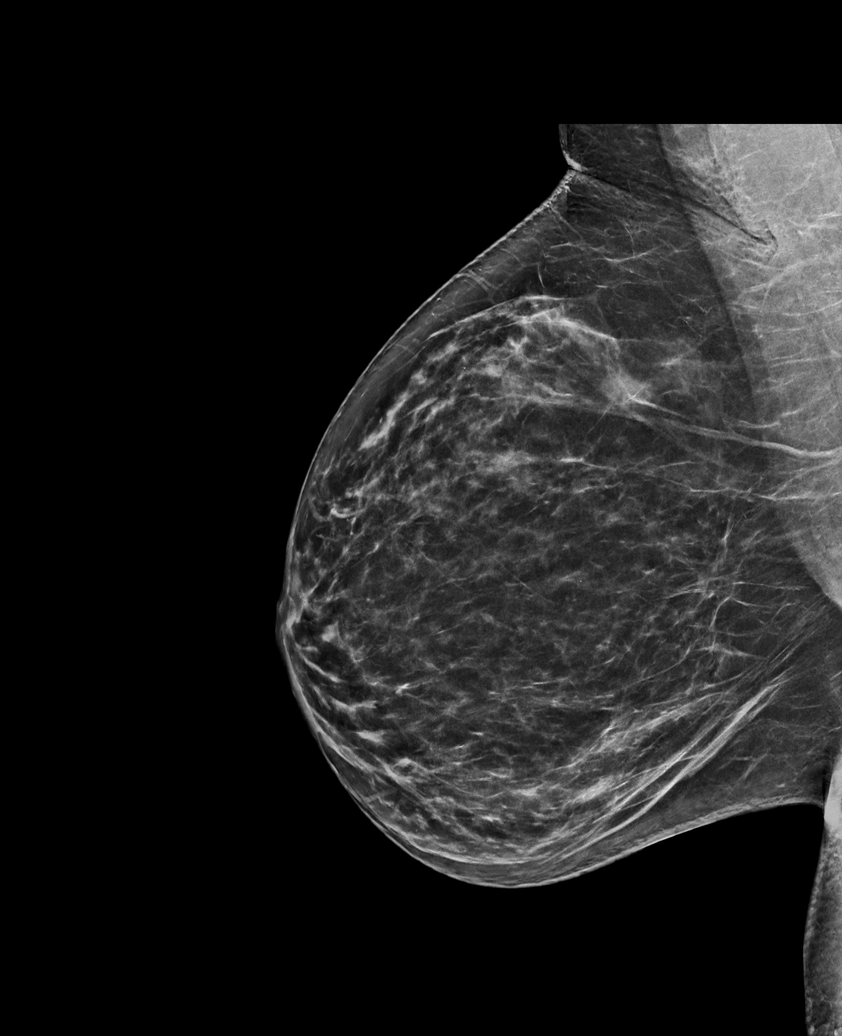

[R MLO synth-2D (2 of 2)]
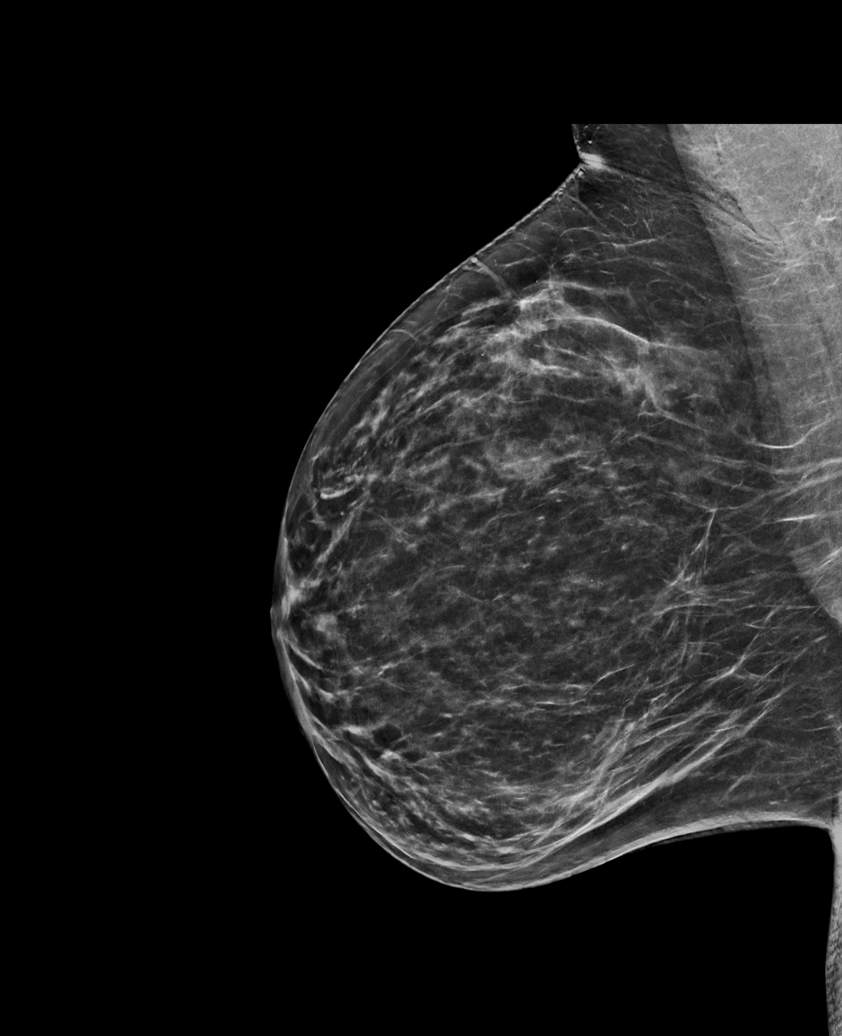

[R CC synth-2D]
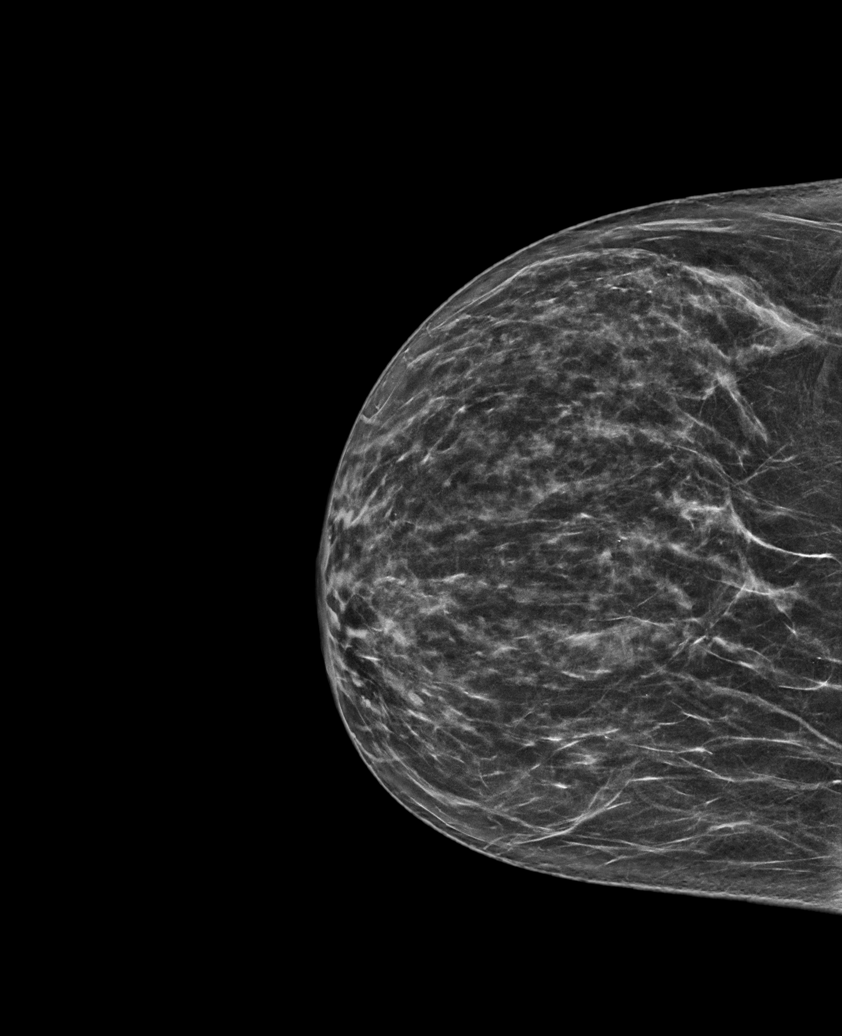

[L CC synth-2D]
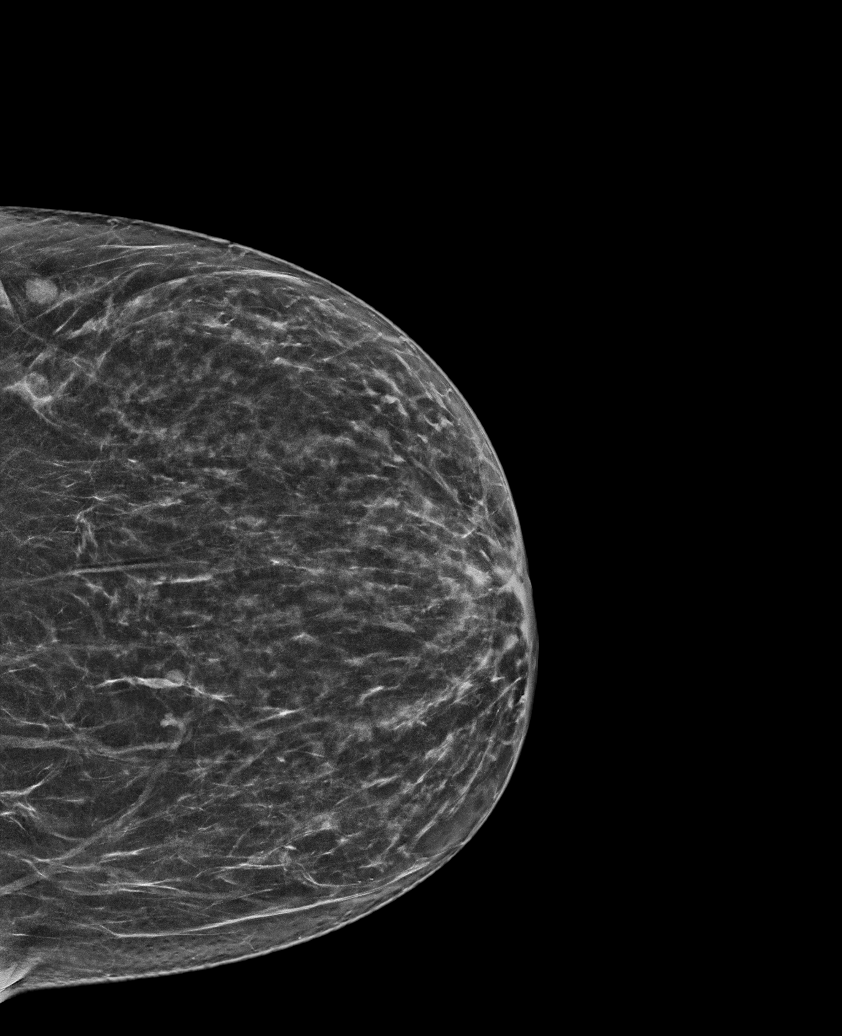

[R MLO tomo · tomo slice 36/71.0]
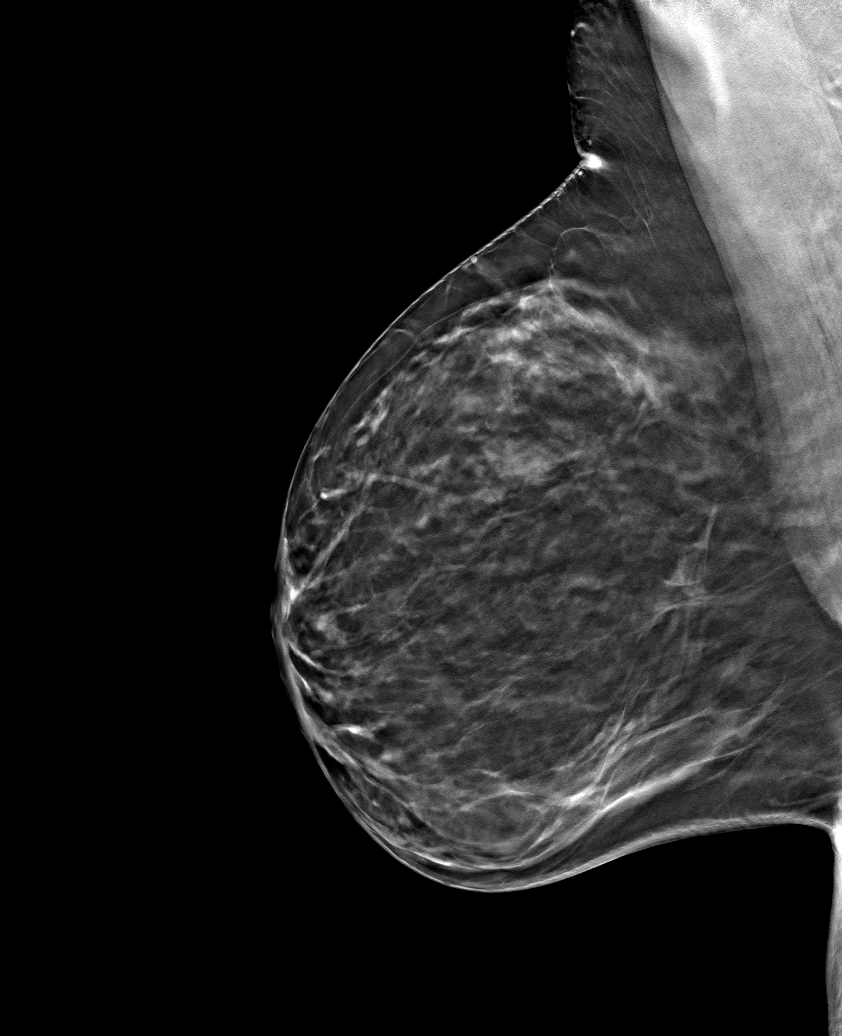

[6 of 30 positions shown; findings below may reference images not displayed]

ACR Breast Density Category b: There are scattered areas of
fibroglandular density.
FINDINGS: Mammogram:

Right breast: No suspicious mass, distortion, or microcalcifications
are identified to suggest presence of malignancy.

Left breast: There are stable masses in the upper outer left breast
posterior depth. There are no new suspicious findings elsewhere in
the left breast.

Ultrasound:

Targeted ultrasound performed in the left breast at 1:30 o'clock 10
cm from the nipple demonstrating 2 clusters of cysts, which actually
[DATE] larger conglomerate cluster of cysts. The first
cluster measures 0.5 x 0.2 cm, previously measuring 0.7 x 0.4 cm and
the second cluster measures 0.7 x 0.3 cm, previously measuring 0.6 x
0.3 cm. In the oblique view overall the cluster of cysts measure
cm, previously 1.9 cm.
IMPRESSION: 1. Stable to slight decrease in size of clusters of cysts in the
left breast at 1:30 o'clock. Given no significant growth/change in
appearance over a 2 year period, these are considered benign.

2.  No mammographic evidence of malignancy bilaterally.

RECOMMENDATION:
Screening mammogram in one year.(Code:[id number])

I have discussed the findings and recommendations with the patient.
If applicable, a reminder letter will be sent to the patient
regarding the next appointment.

BI-RADS CATEGORY  2: Benign.

## 2023-05-30 ENCOUNTER — Other Ambulatory Visit: Payer: Self-pay | Admitting: Obstetrics and Gynecology

## 2023-05-30 DIAGNOSIS — Z1231 Encounter for screening mammogram for malignant neoplasm of breast: Secondary | ICD-10-CM

## 2023-06-14 ENCOUNTER — Ambulatory Visit
Admission: RE | Admit: 2023-06-14 | Discharge: 2023-06-14 | Disposition: A | Discharge status: Home / Self Care | Payer: Managed Care, Other (non HMO) | Source: Ambulatory Visit | Attending: Obstetrics and Gynecology | Admitting: Obstetrics and Gynecology

## 2023-06-14 ENCOUNTER — Ambulatory Visit: Payer: Managed Care, Other (non HMO)

## 2023-06-14 DIAGNOSIS — Z1231 Encounter for screening mammogram for malignant neoplasm of breast: Secondary | ICD-10-CM

## 2024-07-10 ENCOUNTER — Other Ambulatory Visit: Payer: Self-pay | Admitting: Obstetrics and Gynecology

## 2024-07-10 DIAGNOSIS — Z1231 Encounter for screening mammogram for malignant neoplasm of breast: Secondary | ICD-10-CM

## 2024-07-17 ENCOUNTER — Ambulatory Visit
Admission: RE | Admit: 2024-07-17 | Discharge: 2024-07-17 | Disposition: A | Discharge status: Home / Self Care | Source: Ambulatory Visit | Attending: Obstetrics and Gynecology | Admitting: Obstetrics and Gynecology

## 2024-07-17 DIAGNOSIS — Z1231 Encounter for screening mammogram for malignant neoplasm of breast: Secondary | ICD-10-CM

## 2024-07-27 ENCOUNTER — Inpatient Hospital Stay

## 2024-08-03 ENCOUNTER — Other Ambulatory Visit: Payer: Self-pay

## 2024-08-03 ENCOUNTER — Inpatient Hospital Stay

## 2024-08-03 DIAGNOSIS — Z800 Family history of malignant neoplasm of digestive organs: Secondary | ICD-10-CM

## 2024-08-03 DIAGNOSIS — Z803 Family history of malignant neoplasm of breast: Secondary | ICD-10-CM

## 2024-08-03 LAB — GENETIC SCREENING ORDER

## 2024-08-03 NOTE — Progress Notes (Addendum)
 REFERRING PROVIDER: Linnell Denise BRAVO, MD [address] [place],  [place] [address]  PRIMARY PROVIDER:  Patient, No Pcp Per  PRIMARY REASON FOR VISIT:  No diagnosis found.  HISTORY OF PRESENT ILLNESS:   Denise Denise, a 48 y.o. female, was seen for a [place] cancer genetics consultation at the request of gynecologist Denise Almarie Linnell, MD, due to a family history of colon and breast cancer. Denise Denise presents to clinic today to discuss the possibility of a hereditary predisposition to cancer, genetic testing, and to further clarify her future cancer risks, as well as potential cancer risks for family members.   Personal History: Denise Denise is a 48 y.o. female with no personal history of cancer.  CANCER HISTORY:  Oncology History   No history exists.   RISK FACTORS:  Menarche was at age 38.  First live birth at age N/A - No kids.  OCP use for approximately 10 years.  Ovaries intact: yes.  Hysterectomy: no.  Menopausal status: premenopausal.  HRT use: 0 years. Colonoscopy: yes; normal. Mammogram within the last year: yes. Number of breast biopsies: 0. Up to date with pelvic exams: yes. Any excessive radiation exposure in the past: no Tobacco Use: Never No past medical history on file.  Past Surgical History:  Procedure Laterality Date   MOUTH SURGERY      Social History   Socioeconomic History   Marital status: Single    Spouse name: Not on file   Number of children: Not on file   Years of education: Not on file   Highest education level: Not on file  Occupational History   Occupation: Social worker    Employer: [employer]  Tobacco Use   Smoking status: Never   Smokeless tobacco: Never  Vaping Use   Vaping status: Never Used  Substance and Sexual Activity   Alcohol use: Yes    Comment: socially   Drug use: No   Sexual activity: Not on file  Other Topics Concern   Not on file  Social History Narrative   Single, lives alone no children.  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   Prior work in clinical biochemist.   Graduate of [address] originally from [place]   No alcohol tobacco or drug use and 1 caffeinated beverage daily   Social Drivers of Corporate Investment Banker Strain: Not on [employer] Insecurity: Not on file  Transportation Needs: Not on file  Physical Activity: Not on file  Stress: Not on file  Social Connections: Not on file     FAMILY HISTORY:  We obtained a detailed, 4-generation family history.  Significant diagnoses are listed below: Family History  Problem Relation Age of Onset   Colon cancer Mother 14   Diabetes Mother    Hypertension Father    Breast cancer Denise 45   Diabetes Denise    Thyroid disease Denise    Irritable bowel syndrome Denise    Hyperlipidemia Denise    Breast cancer Maternal Aunt    Breast cancer Cousin    Esophageal cancer Neg Hx    Stomach cancer Neg Hx     Pedigree Summary:  Denise Denise has a family history of breast cancer in her  Mother - Colon cancer at 37 Maternal Aunt Breast Cancer 82 Maternal great aunt - 6 Distant maternal cousins with breast cancer - living Cousin with recent diagnosis of bone/prostate - living Denise Denise is unaware of  relatives completing genetic testing for hereditary cancer risks.  There is no reported Ashkenazi Jewish ancestry.   GENETIC COUNSELING ASSESSMENT: Denise Denise is a 48 y.o. female with a family history of breast and colon cancer which is somewhat suggestive of a hereditary cancer predisposition syndrome. We, therefore, discussed and recommended the following at today's visit.   DISCUSSION: We discussed that, in general, most cancer is not inherited in families, but instead is sporadic or familial. Sporadic cancers occur by chance and typically happen at older ages (>50 years) as this type of cancer is caused by genetic changes acquired during an individual's lifetime. Some families have more cancers than  would be expected by chance; however, the ages or types of cancer are not consistent with a known genetic mutation or known genetic mutations have been ruled out. This type of familial cancer is thought to be due to a combination of multiple genetic, environmental, hormonal, and lifestyle factors. While this combination of factors likely increases the risk of cancer, the exact source of this risk is not currently identifiable or testable.  We discussed that 5-10% of cancer is the result of germline (heritable) genetic variants, with most cases associated with BRCA1/BRCA2.  There are other genes that can be associated with hereditary cancer syndromes.  These include MLH1, PMS2, MSH2 MSH6 and EPCAM associated with Lynch Syndrome a hereditary cancer predisposition syndrome associated with increased risk for colon, uterine and ovarian cancers.  We discussed that testing is beneficial for several reasons including knowing how to follow individuals after completing their treatment, identifying whether potential treatment options such as PARP inhibitors would be beneficial, and understanding if other family members could be at risk for cancer and allow them to undergo genetic testing.   We reviewed the characteristics, features, and inheritance patterns of hereditary cancer syndromes. We also discussed genetic testing, including the appropriate family members to test, the process of testing, insurance coverage and turn-around-time for results. Time was spent discussing that it would also be recommended for Denise Denise to be offered genetic testing, as she has a personal history of breast cancer diagnosed below age 37. Denise Denise does not live in [place] , and she was provided information about how her Denise can locate a genetic counselor in her area. We discussed the implications of a negative, positive, carrier and/or variant of uncertain significant result. Denise Denise  was offered a common hereditary  cancer panel (40 genes) and an expanded multi-cancer panel (76 genes). Denise Denise was informed of the benefits and limitations of each panel, including that expanded multi-cancer panels contain genes that do not have clear management guidelines at this point in time. We also discussed that as the number of genes included on a panel increases, the chances of variants of uncertain significance increases.  Genetic Testing Consent:  After considering the risks, benefits, and limitations, Denise Denise provided informed consent to pursue genetic testing.  A blood sample was sent to [facility] for analysis of the CancerNext+RNA Panel. Results should be available within approximately 2-3 weeks' time, at which point they will be disclosed by telephone to Denise Denise , as will any additional recommendations warranted by these results. Denise Denise will receive a summary of her genetic counseling visit and a copy of her results once available. This information will also be available in Epic.  The [facility] CancerNext+RNAinsight Panel includes sequencing, rearrangement analysis, and RNA analysis for the following 40 genes: APC, ATM, BAP1, BARD1, BMPR1A, BRCA1, BRCA2, BRIP1, CDH1,  CDKN2A, CHEK2, FH, FLCN, MET, MLH1, MSH2, MSH6, MUTYH, NF1, NTHL1, PALB2, PMS2, PTEN, RAD51C, RAD51D, RPS20, SMAD4, STK11, TP53, TSC1, TSC2 and VHL (sequencing and deletion/duplication); AXIN2, HOXB13, MBD4, MSH3, POLD1 and POLE (sequencing only); EPCAM and GREM1 (deletion/duplication only). RNA data is routinely analyzed for use in variant interpretation for all genes.   Genetic Information Nondiscrimination Act (GINA): We discussed that some people do not want to undergo genetic testing due to fear of genetic discrimination.  The Genetic Information Nondiscrimination Act (GINA) was signed into federal law in 2008. GINA prohibits health insurers and most employers from discriminating against individuals based on genetic information (including the results of  genetic tests and family history information). According to GINA, health insurance companies cannot consider genetic information to be a preexisting condition, nor can they use it to make decisions regarding coverage or rates. GINA also makes it illegal for most employers to use genetic information in making decisions about hiring, firing, promotion, or terms of employment. It is important to note that GINA does not offer protections for life insurance, disability insurance, or long-term care insurance. GINA does not apply to those in the [employer], those who work for companies with less than 15 employees, and new life insurance or long-term disability insurance policies.  Health status due to a cancer diagnosis is not protected under GINA. More information about GINA can be found by visiting [email or web address].  Cost & Coverage: Based on Denise Denise's family history of breast cancer in her Denise dianosed at age 78 (>55 years old), she meets medical criteria for genetic testing based on the [employer] (NCCN) guidelines. Despite that she meets criteria, she may still have an out of pocket cost. We discussed that if her out of pocket cost for testing is over $100, the laboratory will call and confirm whether she wants to proceed with testing.  If the out of pocket cost of testing is less than $100 she will be billed by the genetic testing laboratory.   Statistical Models Based on the patient's family history, the Tyrer-Cuzick statistical mode was used to estimate her lifetime risk of developing breast cancer. This estimates her lifetime risk of developing breast to be approximately 20.5%. This estimation does not consider any genetic testing results.  The patient's lifetime breast cancer risk is a preliminary estimate based on available information using one of several models endorsed by the American Cancer Society (ACS). The ACS recommends consideration of breast MRI screening as an adjunct  to mammography for patients at high risk (defined as 20% or greater lifetime risk). Please note that a woman's breast cancer risk changes over time. It may increase or decrease based on age and any changes to the personal and/or family medical history. The risks and recommendations listed above apply to this patient at this point in time. In the future, she may or may not be eligible for the same medical management strategies and, in some cases, other medical management strategies may become available to her. If she is interested in an updated breast cancer risk assessment at a later date, she can contact us .  The Tyrer-Cuzick model is one of multiple prediction models developed to estimate an individual's lifetime risk of developing breast cancer. The Tyrer-Cuzick model is endorsed by the [employer] (NCCN). This model includes many risk factors such as family history, endogenous estrogen exposure, and benign breast disease. The calculation is highly-dependent on the accuracy of clinical data provided by the patient and can change  over time. The Tyrer-Cuzick model may be repeated to reflect new information in her personal or family history in the future.   Denise Denise has been determined to be at high risk for breast cancer.  her Tyrer-Cuzick risk score is 20.5%. For women with a greater than 20% lifetime risk of breast cancer, the [employer] (NCCN) recommends the following:  1.      Clinical encounter every 6-12 months to begin when identified as being at increased risk, but not before age 85  2.      Annual mammograms. Tomosynthesis is recommended starting 10 years earlier than the youngest breast cancer diagnosis in the family or at age 76 (whichever comes first), but not before age 39   1.      Annual breast MRI starting 10 years earlier than the youngest breast cancer diagnosis in the family or at age 62 (whichever comes first), but not before age 83.     Lastly, we encouraged Denise Denise to remain in contact with cancer genetics annually so that we can continuously update the family history and inform her of any changes in cancer genetics and testing that may be of benefit for this family.   Denise Denise questions were answered to her satisfaction today. Our contact information was provided should additional questions or concerns arise. Thank you for the referral and allowing us  to share in the care of your patient.   RESOURCES:  Denise Denise was provided with the following:  The [facility] Cancer Next Panel gene list [facility] Hereditary Cancer Testing Patient Guide [facility] Billing information  Information to support her Denise locating a genetic counselor PLAN:  Testing order: [facility] CancerNext+RNAinsight Panel Discuss a referral to the High-Risk Breast Clinic at the time of result disclosure   Santana Fryer, MS, CGC  Certified Genetic Counselor  Email: [email or web address]  Phone: [phone]   In total, 60 minutes were spent on the date of the encounter in service to the patient including preparation, face-to-face consultation, documentation and care coordination. The patient was seen alone. Drs. Lanny Stalls, and/or Gudena were available for questions, if needed. _______________________________________________________________________ For Office Staff:  Number of people involved in session: 1 Was an Intern/ student involved with case: no

## 2024-08-06 DIAGNOSIS — Z800 Family history of malignant neoplasm of digestive organs: Secondary | ICD-10-CM | POA: Insufficient documentation

## 2024-08-06 DIAGNOSIS — Z803 Family history of malignant neoplasm of breast: Secondary | ICD-10-CM | POA: Insufficient documentation

## 2024-09-11 ENCOUNTER — Telehealth: Payer: Self-pay | Admitting: Licensed Clinical Social Worker

## 2024-09-15 ENCOUNTER — Ambulatory Visit: Payer: Self-pay

## 2024-09-15 NOTE — Telephone Encounter (Signed)
 I contacted Ms. Hunkele to discuss her genetic testing results. No pathogenic variants were identified in the 40 genes analyzed. Detailed clinic note to follow.   The test report has been scanned into EPIC and is located under the Molecular Pathology section of the Results Review tab.  A portion of the result report is included below for reference.      Dena Cary, MS, [facility] Genetic Counselor [email or web address] Phone: [phone]

## 2024-09-15 NOTE — Telephone Encounter (Signed)
 I attempted to contact Denise Jones to determine if she is interested in being referred to the high-risk breast clinic for management based on her elevated life time risk of breast cancer.   Based on Denise Jones's personal history and family of cancer the Tyrer-Cuzick statistical model was used to estimate her lifetime risk of developing breast cancer. Her lifetime risk estimate of developing breast cancer is approximately 20.5%.  The [employer] ([employer]). recommends consideration of breast MRI screening as an adjunct to mammography for patients at high risk (defined as 20% or greater lifetime risk).

## 2024-09-15 NOTE — Progress Notes (Addendum)
 HPI:  Denise Jones was previously seen in the [facility] due to a family history of cancer and concerns regarding a hereditary predisposition to cancer. Please refer to our prior cancer genetics clinic note for more information regarding our discussion, assessment and recommendations, at the time. Ms. Coull recent genetic test results were disclosed to her, as were recommendations warranted by these results. These results and recommendations are discussed in more detail below.  FAMILY HISTORY:  We obtained a detailed, 4-generation family history.  Significant diagnoses are listed below: Family History  Problem Relation Age of Onset   Colon cancer Mother 36   Diabetes Mother    Hypertension Father    Colon polyps Brother    Breast cancer Maternal Aunt    Breast cancer Cousin    Heart disease Maternal Grandmother    Breast cancer Other    Breast cancer Maternal Aunt 62   Aneurysm Maternal Cousin 20 - 29   Breast cancer Half-Sister 75   Colon polyps Half-Sister    Esophageal cancer Neg Hx    Stomach cancer Neg Hx     GENETIC TEST RESULTS: Genetic testing reported out on 08/18/2024 through the [facility] cancer panel found no pathogenic mutations. [facility] gene panel which includes sequencing, rearrangement analysis, and RNA analysis for the following 40 genes: APC, ATM, BAP1, BARD1, BMPR1A, BRCA1, BRCA2, BRIP1, CDH1, CDKN2A, CHEK2, FH, FLCN, MET, MLH1, MSH2, MSH6, MUTYH, NF1, NTHL1, PALB2, PMS2, PTEN, RAD51C, RAD51D, RPS20, SMAD4, STK11, TP53, TSC1, TSC2, and VHL (sequencing and deletion/duplication); AXIN2, HOXB13, MBD4, MSH3, POLD1 and POLE (sequencing only); EPCAM and GREM1 (deletion/duplication only).  The test report has been scanned into EPIC and is located under the Molecular Pathology section of the Results Review tab.  A portion of the result report is included below for reference.    We discussed with Ms. Stanke that because  current genetic testing is not perfect, it is possible there may be a gene mutation in one of these genes that current testing cannot detect, but that chance is small.  We also discussed, that there could be another gene that has not yet been discovered, or that we have not yet tested, that is responsible for the cancer diagnoses in the family. It is also possible there is a hereditary cause for the cancer in the family that Ms. Kem did not inherit and therefore was not identified in her testing.  Therefore, it is important to remain in touch with cancer genetics in the future so that we can continue to offer Ms. Stencil the most up to date genetic testing.   ADDITIONAL GENETIC TESTING: We discussed with Ms. Mcconathy that there are other genes that are associated with increased cancer risk that can be analyzed. Should Ms. Olesen wish to pursue additional genetic testing, we are happy to discuss and coordinate this testing, at any time.    We discussed with Ms. Blackie that her genetic testing was fairly extensive.  If there are genes identified to increase cancer risk that can be analyzed in the future, we would be happy to discuss and coordinate this testing at that time.    CANCER SCREENING RECOMMENDATIONS: Ms. Fines test result is considered negative (normal).  This means that we have not identified a hereditary cause for her family history of cancer at this time. Most cancers happen by chance and this negative test suggests that her family history of cancer may fall into this category.  Possible reasons for Ms. Dippolito's negative genetic test include:  1. There may be a gene mutation in one of these genes that current testing methods cannot detect but that chance is small.  2. There could be another gene that has not yet been discovered, or that we have not yet tested, that is responsible for the cancer diagnoses in the family.  3.  There may be no hereditary risk for cancer in the family. The cancers in Ms.  Hausmann and/or her family may be sporadic/familial or due to other genetic and environmental factors. 4. It is also possible there is a hereditary cause for the cancer in the family that Ms. Heatley did not inherit.  Therefore, it is recommended she continue to follow the cancer management and screening guidelines provided by her primary healthcare provider. An individual's cancer risk and medical management are not determined by genetic test results alone. Overall cancer risk assessment incorporates additional factors, including personal medical history, family history, and any available genetic information that may result in a personalized plan for cancer prevention and surveillance  Given Ms. Duncanson's family histories, we must interpret these negative results with some caution.  Families with features suggestive of hereditary risk for cancer tend to have multiple family members with cancer, diagnoses in multiple generations and diagnoses before the age of 55. Ms. Liscano family exhibits some of these features. Thus, this result may simply reflect our current inability to detect all mutations within these genes or there may be a different gene that has not yet been discovered or tested.   An individual's cancer risk and medical management are not determined by genetic test results alone. Overall cancer risk assessment incorporates additional factors, including personal medical history, family history, and any available genetic information that may result in a personalized plan for cancer prevention and surveillance.  Based on Ms. Heathman's family of cancer, as well as her genetic test results the Tyrer-Cuzick statistical model  was used to estimate her risk of developing breast cancer. This estimates her lifetime risk of developing breast cancer to be approximately 20.5%.  The patient's lifetime breast cancer risk is a preliminary estimate based on available information using one of several models endorsed by the  [employer] ([employer]CCN).  The NCCN recommends consideration of breast MRI screening as an adjunct to mammography for patients at high risk (defined as 20% or greater lifetime risk). Please note that a woman's breast cancer risk changes over time. It may increase or decrease based on age and any changes to the personal and/or family medical history. The risks and recommendations listed above apply to this patient at this point in time. In the future, she may or may not be eligible for the same medical management strategies and, in some cases, other medical management strategies may become available to her. If she is interested in an updated breast cancer risk assessment at a later date, she can contact us .   Ms. Guin has been determined to be at high risk for breast cancer. her Tyrer-Cuzick risk score is 20.5%.  For women with a greater than 20% lifetime risk of breast cancer, the [employer] (NCCN) recommends the following:   1.      Clinical encounter every 6-12 months to begin when identified as being at increased risk, but not before age 46  2.      Annual mammograms. Tomosynthesis is recommended starting 10 years earlier than the youngest breast cancer diagnosis in the family  or at age 70 (whichever comes first), but not before age 64    3.      Annual breast MRI starting 10 years earlier than the youngest breast cancer diagnosis in the family or at age 50 (whichever comes first), but not before age 82.   We, therefore, discussed that it is reasonable for Ms. Schader to be followed by a high-risk breast cancer clinic; in addition to a yearly mammogram and physical exam by a healthcare provider, she should discuss the usefulness of an annual breast MRI with the high-risk clinic providers.  TYRER-CUZICK RISK CALCULATION  RECOMMENDATIONS FOR FAMILY MEMBERS:  Individuals in this family might be at some increased risk of developing cancer, over the general  population risk, simply due to the family history of cancer.  We recommended women in this family have a yearly mammogram beginning at age 92, or 90 years younger than the earliest onset of cancer, an annual clinical breast exam, and perform monthly breast self-exams. Women in this family should also have a gynecological exam as recommended by their primary provider. All family members should be referred for colonoscopy starting at age 56, or 60 years younger than the earliest onset of cancer.  It is also possible there is a hereditary cause for the cancer in Ms. Feutz's family that she did not inherit and therefore was not identified in her.   FOLLOW-UP: Lastly, we discussed with Ms. Vessel that cancer genetics is a rapidly advancing field and it is possible that new genetic tests will be appropriate for her and/or her family members in the future. We encouraged her to remain in contact with cancer genetics on an annual basis so we can update her personal and family histories and let her know of advances in cancer genetics that may benefit this family.   Our contact number was provided. Ms. Desena questions were answered to her satisfaction, and she knows she is welcome to call us  at anytime with additional questions or concerns.   Santana Fryer, MS, CGC  Certified Genetic Counselor  Email: [email or web address]  Phone: [phone]
